# Patient Record
Sex: Female | Born: 1975 | Race: White | Hispanic: No | Marital: Single | Smoking: Current every day smoker
Health system: Southern US, Community
[De-identification: ages and names within clinical notes are randomized; demographics above are authoritative.]

## PROBLEM LIST (undated history)

## (undated) DIAGNOSIS — R1115 Cyclical vomiting syndrome unrelated to migraine: Secondary | ICD-10-CM

## (undated) DIAGNOSIS — F329 Major depressive disorder, single episode, unspecified: Secondary | ICD-10-CM

## (undated) DIAGNOSIS — F32A Depression, unspecified: Secondary | ICD-10-CM

---

## 2017-01-25 ENCOUNTER — Emergency Department (HOSPITAL_BASED_OUTPATIENT_CLINIC_OR_DEPARTMENT_OTHER)
Admission: EM | Admit: 2017-01-25 | Discharge: 2017-01-26 | Disposition: A | Discharge status: Home / Self Care | Payer: Self-pay | Attending: Emergency Medicine | Admitting: Emergency Medicine

## 2017-01-25 ENCOUNTER — Emergency Department (HOSPITAL_BASED_OUTPATIENT_CLINIC_OR_DEPARTMENT_OTHER): Payer: Self-pay

## 2017-01-25 ENCOUNTER — Encounter (HOSPITAL_BASED_OUTPATIENT_CLINIC_OR_DEPARTMENT_OTHER): Payer: Self-pay | Admitting: Emergency Medicine

## 2017-01-25 DIAGNOSIS — R1115 Cyclical vomiting syndrome unrelated to migraine: Secondary | ICD-10-CM

## 2017-01-25 DIAGNOSIS — G43A0 Cyclical vomiting, not intractable: Secondary | ICD-10-CM | POA: Insufficient documentation

## 2017-01-25 DIAGNOSIS — F17200 Nicotine dependence, unspecified, uncomplicated: Secondary | ICD-10-CM | POA: Insufficient documentation

## 2017-01-25 DIAGNOSIS — Z79899 Other long term (current) drug therapy: Secondary | ICD-10-CM | POA: Insufficient documentation

## 2017-01-25 HISTORY — DX: Cyclical vomiting syndrome unrelated to migraine: R11.15

## 2017-01-25 HISTORY — DX: Depression, unspecified: F32.A

## 2017-01-25 HISTORY — DX: Major depressive disorder, single episode, unspecified: F32.9

## 2017-01-25 LAB — CBC WITH DIFFERENTIAL/PLATELET
BASOS ABS: 0 10*3/uL (ref 0.0–0.1)
Basophils Relative: 0 %
EOS ABS: 0 10*3/uL (ref 0.0–0.7)
Eosinophils Relative: 0 %
HEMATOCRIT: 41.2 % (ref 36.0–46.0)
HEMOGLOBIN: 13.6 g/dL (ref 12.0–15.0)
LYMPHS PCT: 6 %
Lymphs Abs: 1.2 10*3/uL (ref 0.7–4.0)
MCH: 29.6 pg (ref 26.0–34.0)
MCHC: 33 g/dL (ref 30.0–36.0)
MCV: 89.8 fL (ref 78.0–100.0)
MONOS PCT: 2 %
Monocytes Absolute: 0.4 10*3/uL (ref 0.1–1.0)
Neutro Abs: 18.6 10*3/uL — ABNORMAL HIGH (ref 1.7–7.7)
Neutrophils Relative %: 92 %
Platelets: 290 10*3/uL (ref 150–400)
RBC: 4.59 MIL/uL (ref 3.87–5.11)
RDW: 14.6 % (ref 11.5–15.5)
WBC: 20.2 10*3/uL — AB (ref 4.0–10.5)

## 2017-01-25 LAB — COMPREHENSIVE METABOLIC PANEL
ALBUMIN: 4.9 g/dL (ref 3.5–5.0)
ALK PHOS: 89 U/L (ref 38–126)
ALT: 38 U/L (ref 14–54)
AST: 66 U/L — AB (ref 15–41)
Anion gap: 13 (ref 5–15)
BILIRUBIN TOTAL: 0.6 mg/dL (ref 0.3–1.2)
BUN: 10 mg/dL (ref 6–20)
CALCIUM: 9.5 mg/dL (ref 8.9–10.3)
CO2: 21 mmol/L — ABNORMAL LOW (ref 22–32)
Chloride: 104 mmol/L (ref 101–111)
Creatinine, Ser: 0.68 mg/dL (ref 0.44–1.00)
GFR calc Af Amer: >60 mL/min (ref >60)
GFR calc non Af Amer: >60 mL/min (ref >60)
GLUCOSE: 154 mg/dL — AB (ref 65–99)
Potassium: 4 mmol/L (ref 3.5–5.1)
SODIUM: 138 mmol/L (ref 135–145)
TOTAL PROTEIN: 8.7 g/dL — AB (ref 6.5–8.1)

## 2017-01-25 LAB — URINALYSIS, ROUTINE W REFLEX MICROSCOPIC
Bilirubin Urine: NEGATIVE
GLUCOSE, UA: NEGATIVE mg/dL
KETONES UR: 15 mg/dL — AB
LEUKOCYTES UA: NEGATIVE
NITRITE: NEGATIVE
PH: 6 (ref 5.0–8.0)
Protein, ur: >300 mg/dL — AB

## 2017-01-25 LAB — URINALYSIS, MICROSCOPIC (REFLEX)

## 2017-01-25 LAB — PREGNANCY, URINE: Preg Test, Ur: NEGATIVE

## 2017-01-25 LAB — LIPASE, BLOOD: LIPASE: 30 U/L (ref 11–51)

## 2017-01-25 MED ORDER — PROMETHAZINE HCL 25 MG/ML IJ SOLN
25.0000 mg | Freq: Once | INTRAMUSCULAR | Status: AC
  Administered 2017-01-25: 25 mg via INTRAVENOUS
  Filled 2017-01-25: qty 1

## 2017-01-25 MED ORDER — HALOPERIDOL LACTATE 5 MG/ML IJ SOLN
2.0000 mg | Freq: Once | INTRAMUSCULAR | Status: AC
  Administered 2017-01-25: 2 mg via INTRAVENOUS
  Filled 2017-01-25: qty 1

## 2017-01-25 MED ORDER — SODIUM CHLORIDE 0.9 % IV BOLUS (SEPSIS)
1000.0000 mL | Freq: Once | INTRAVENOUS | Status: AC
  Administered 2017-01-25: 1000 mL via INTRAVENOUS

## 2017-01-25 MED ORDER — ONDANSETRON 4 MG PO TBDP
4.0000 mg | ORAL_TABLET | Freq: Once | ORAL | Status: DC | PRN
Start: 2017-01-25 — End: 2017-01-25
  Filled 2017-01-25: qty 1

## 2017-01-25 MED ORDER — CAPSAICIN 0.075 % EX CREA
TOPICAL_CREAM | CUTANEOUS | Status: AC
  Administered 2017-01-25: 22:00:00 via TOPICAL
  Filled 2017-01-25: qty 60

## 2017-01-25 MED ORDER — ONDANSETRON 4 MG PO TBDP
4.0000 mg | ORAL_TABLET | Freq: Once | ORAL | Status: AC | PRN
  Administered 2017-01-25: 4 mg via ORAL

## 2017-01-25 NOTE — ED Notes (Signed)
Unable to perform EKG-pt out of room in radiology.

## 2017-01-25 NOTE — ED Notes (Signed)
Patient now laying down  With blanket on couch in hallway  - the patient given zofran and tolerated.

## 2017-01-25 NOTE — ED Triage Notes (Addendum)
Patient states that she has a history of cyclical vomiting syndrome. THe patient is very anxious and hyperventaling and vomiting constantly in triage, keeps asking to lay down . Reports that she went to [facility] but their computers were down so she came here instead. Reports that she usually gets phenergan and Reglan  - Zofran does not help. While triaging the patient she reports that she is having chest pain to her left chest

## 2017-01-25 NOTE — ED Notes (Signed)
VO obtained from EDP to try Capsicum cream topically

## 2017-01-25 NOTE — ED Notes (Signed)
Pt. Actively vomiting asking for phenergan

## 2017-01-25 NOTE — ED Provider Notes (Signed)
[facility] EMERGENCY DEPARTMENT Provider Note   CSN: [id number] Arrival date & time: 01/25/17  1846     History   Chief Complaint Chief Complaint  Patient presents with  . Emesis    HPI Karen Hawkins is a 41 y.o. female.  HPI 41 year old female who presents with nausea and vomiting. She has a history of cyclic vomiting. Reports onset of intractable nausea and vomiting at 6 AM this morning. Associated with upper abdominal pain. States that this is consistent with her typical cyclical vomiting syndrome which she has flareups every month.also complains of chest pain from vomiting but denies any shortness of breath, cough or fever. Denies any diarrhea or constipation, dysuria or urinary frequency.   Past Medical History:  Diagnosis Date  . Cyclical vomiting   . Depression     There are no active problems to display for this patient.   History reviewed. No pertinent surgical history.  OB History    No data available       Home Medications    Prior to Admission medications   Medication Sig Start Date End Date Taking? Authorizing Provider  promethazine (PHENERGAN) 50 MG tablet Take 50 mg by mouth every 6 (six) hours as needed for nausea or vomiting.   Yes [provider]  sertraline (ZOLOFT) 100 MG tablet Take 100 mg by mouth daily.   Yes [provider]  metoCLOPramide (REGLAN) 10 MG tablet Take 1 tablet (10 mg total) by mouth every 6 (six) hours. 01/26/17   Lavera Guise, MD    Family History History reviewed. No pertinent family history.  Social History Social History  Substance Use Topics  . Smoking status: Current Every Day Smoker  . Smokeless tobacco: Never Used  . Alcohol use No     Allergies   Diprivan [propofol]; Doxycycline; Hydrocodone; Oxycodone; and Penicillins   Review of Systems Review of Systems  Constitutional: Negative for fever.  Respiratory: Negative for shortness of breath.   Cardiovascular: Positive for  chest pain.  Gastrointestinal: Positive for abdominal pain, nausea and vomiting.  All other systems reviewed and are negative.    Physical Exam Updated Vital Signs BP (!) 154/86 (BP Location: Left Arm)   Pulse 72   Temp 98.5 F (36.9 C) (Oral)   Resp 18   Wt 99.8 kg (220 lb)   LMP 01/25/2017   SpO2 98%   Physical Exam Physical Exam  Nursing note and vitals reviewed. Constitutional: Well developed, well nourished, non-toxic, and in no acute distress Head: Normocephalic and atraumatic.  Mouth/Throat: Oropharynx is clear and moist.  Neck: Normal range of motion. Neck supple.  Cardiovascular: Normal rate and regular rhythm.   Pulmonary/Chest: Effort normal and breath sounds normal.  Abdominal: Soft. There is epigastric tenderness. There is no rebound and no guarding.  Musculoskeletal: Normal range of motion.  Neurological: Alert, no facial droop, fluent speech, moves all extremities symmetrically Skin: Skin is warm and dry.  Psychiatric: Cooperative   ED Treatments / Results  Labs (all labs ordered are listed, but only abnormal results are displayed) Labs Reviewed  CBC WITH DIFFERENTIAL/PLATELET - Abnormal; Notable for the following:       Result Value   WBC 20.2 (*)    Neutro Abs 18.6 (*)    All other components within normal limits  COMPREHENSIVE METABOLIC PANEL - Abnormal; Notable for the following:    CO2 21 (*)    Glucose, Bld 154 (*)    Total Protein 8.7 (*)  AST 66 (*)    All other components within normal limits  URINALYSIS, ROUTINE W REFLEX MICROSCOPIC - Abnormal; Notable for the following:    Specific Gravity, Urine >1.030 (*)    Hgb urine dipstick LARGE (*)    Ketones, ur 15 (*)    Protein, ur >300 (*)    All other components within normal limits  URINALYSIS, MICROSCOPIC (REFLEX) - Abnormal; Notable for the following:    Bacteria, UA MANY (*)    Squamous Epithelial / LPF 0-5 (*)    All other components within normal limits  PREGNANCY, URINE  LIPASE,  BLOOD  TROPONIN I    EKG  EKG Interpretation  Date/Time:  Friday January 25 2017 23:58:32 EDT Ventricular Rate:  90 PR Interval:    QRS Duration: 156 QT Interval:  436 QTC Calculation: 534 R Axis:   66 Text Interpretation:  Sinus rhythm Left bundle branch block no previous EKG  Confirmed by Crista CurbLiu, Milen Lengacher [phone]) on 01/26/2017 12:01:55 AM       Radiology Dg Chest 2 View  Result Date: 01/25/2017 CLINICAL DATA:  Nausea and vomiting beginning at 6 a.m. this morning. Left-sided chest pain. EXAM: CHEST  2 VIEW COMPARISON:  10/21/2016 FINDINGS: The heart size and mediastinal contours are within normal limits. Both lungs are clear. The visualized skeletal structures are unremarkable. IMPRESSION: No active cardiopulmonary disease. Electronically Signed   By: Burman NievesWilliam  Stevens M.D.   On: 01/25/2017 23:19    Procedures Procedures (including critical care time)  Medications Ordered in ED Medications  metoCLOPramide (REGLAN) injection 10 mg (not administered)  ondansetron (ZOFRAN-ODT) disintegrating tablet 4 mg (4 mg Oral Given 01/25/17 2007)  sodium chloride 0.9 % bolus 1,000 mL (1,000 mLs Intravenous New Bag/Given 01/25/17 2135)  capsicum (ZOSTRIX) 0.075 % cream ( Topical Given 01/25/17 2136)  promethazine (PHENERGAN) injection 25 mg (25 mg Intravenous Given 01/25/17 2323)  haloperidol lactate (HALDOL) injection 2 mg (2 mg Intravenous Given 01/25/17 2320)     Initial Impression / Assessment and Plan / ED Course  I have reviewed the triage vital signs and the nursing notes.  Pertinent labs & imaging results that were available during my care of the patient were reviewed by me and considered in my medical decision making (see chart for details).     Presenting with n/v consistent with prior episodes of cyclic vomiting.   Vitals stable. Abdomen soft, non-surgical. Upper tenderness noted. Low suspicion for serious intraabdominal process.   Blood work with leukocytosis of 20. Suspect  that this is stress related from vomiting. No concerns for infection. UA w/o signs of infection. CXR w/o acute cardiopulmonary processes. Remainder of blood work unremarkable.   Treated symptomatically. Received IVF, zofran, Capsacin, phenergan and haldol. Initially feeling well, but after PO challenging did have some vomiting. She is requesting reglan to see if she can improve to go home. Will trial reglan, and discussed admission for persistent nausea/vomiting. She is agreeable with the plan.   Final Clinical Impressions(s) / ED Diagnoses   Final diagnoses:  Non-intractable cyclical vomiting with nausea    New Prescriptions New Prescriptions   METOCLOPRAMIDE (REGLAN) 10 MG TABLET    Take 1 tablet (10 mg total) by mouth every 6 (six) hours.     Lavera GuiseLiu, Tanmay Halteman Duo, MD 01/26/17 [phone]

## 2017-01-25 NOTE — ED Notes (Signed)
Patient offered zofran while waiting reports " I will take it, that wont mess me up from getting my phenergan?"

## 2017-01-26 LAB — TROPONIN I: Troponin I: <0.03 ng/mL (ref <0.03)

## 2017-01-26 MED ORDER — PROMETHAZINE HCL 25 MG/ML IJ SOLN: 25.0000 mg | Freq: Once | INTRAMUSCULAR | Status: DC

## 2017-01-26 MED ORDER — METOCLOPRAMIDE HCL 10 MG PO TABS: 10.0000 mg | ORAL_TABLET | Freq: Four times a day (QID) | ORAL | 0 refills | Status: AC

## 2017-01-26 MED ORDER — METOCLOPRAMIDE HCL 5 MG/ML IJ SOLN
10.0000 mg | Freq: Once | INTRAMUSCULAR | Status: AC
  Administered 2017-01-26: 10 mg via INTRAVENOUS
  Filled 2017-01-26: qty 2

## 2017-01-26 MED ORDER — PROMETHAZINE HCL 25 MG RE SUPP: 25.0000 mg | Freq: Four times a day (QID) | RECTAL | 0 refills | Status: AC | PRN

## 2017-01-26 NOTE — ED Provider Notes (Addendum)
  Physical Exam  BP (!) 154/86 (BP Location: Left Arm)   Pulse 72   Temp 98.5 F (36.9 C) (Oral)   Resp 18   Wt 99.8 kg (220 lb)   LMP 01/25/2017   SpO2 98%   Physical Exam  ED Course  Procedures  MDM  Assuming care of patient from Dr. Verdie MosherLiu   Patient in the ED for nausea and emesis. Workup thus far shows elevated white count, which is presumed to be reactive given normal exam and no peritoneal signs.  Concerning findings are as following  Important pending results are : po challenge  According to Dr. Verdie MosherLiu, Pt has received zofran, haldol, capsacin, promethazine. She is getting reglan now. If patient doesn't improve she will need admission.  Patient had no complains, no concerns from the nursing side. Will continue to monitor.  3:49 AM Pt reassessed. Pt's VSS and WNL. Pt's cap refill < 3 seconds. Pt has been hydrated in the ER and now passed po challenge. We will discharge with antiemetic. Strict ER return precautions have been discussed and pt will return if he is unable to tolerate fluids and symptoms are getting worse.           Derwood KaplanNanavati, Zaylon Bossier, MD 01/26/17 [id number]    Derwood KaplanNanavati, Delainy Mcelhiney, MD 01/26/17 [phone]

## 2017-01-26 NOTE — Discharge Instructions (Signed)
Continue to take nausea medications at home.  Return for worsening symptoms, including fever, escalating pain, intractable vomiting or any other symptoms concerning to you.

## 2019-06-28 IMAGING — CR DG CHEST 2V
2 series · 2 of 2 positions shown · non-contrast
Comparison: 10/21/2016

CLINICAL DATA: Nausea and vomiting beginning at 6 a.m. this
morning. Left-sided chest pain.

EXAM:
CHEST  2 VIEW

[w chest pa]
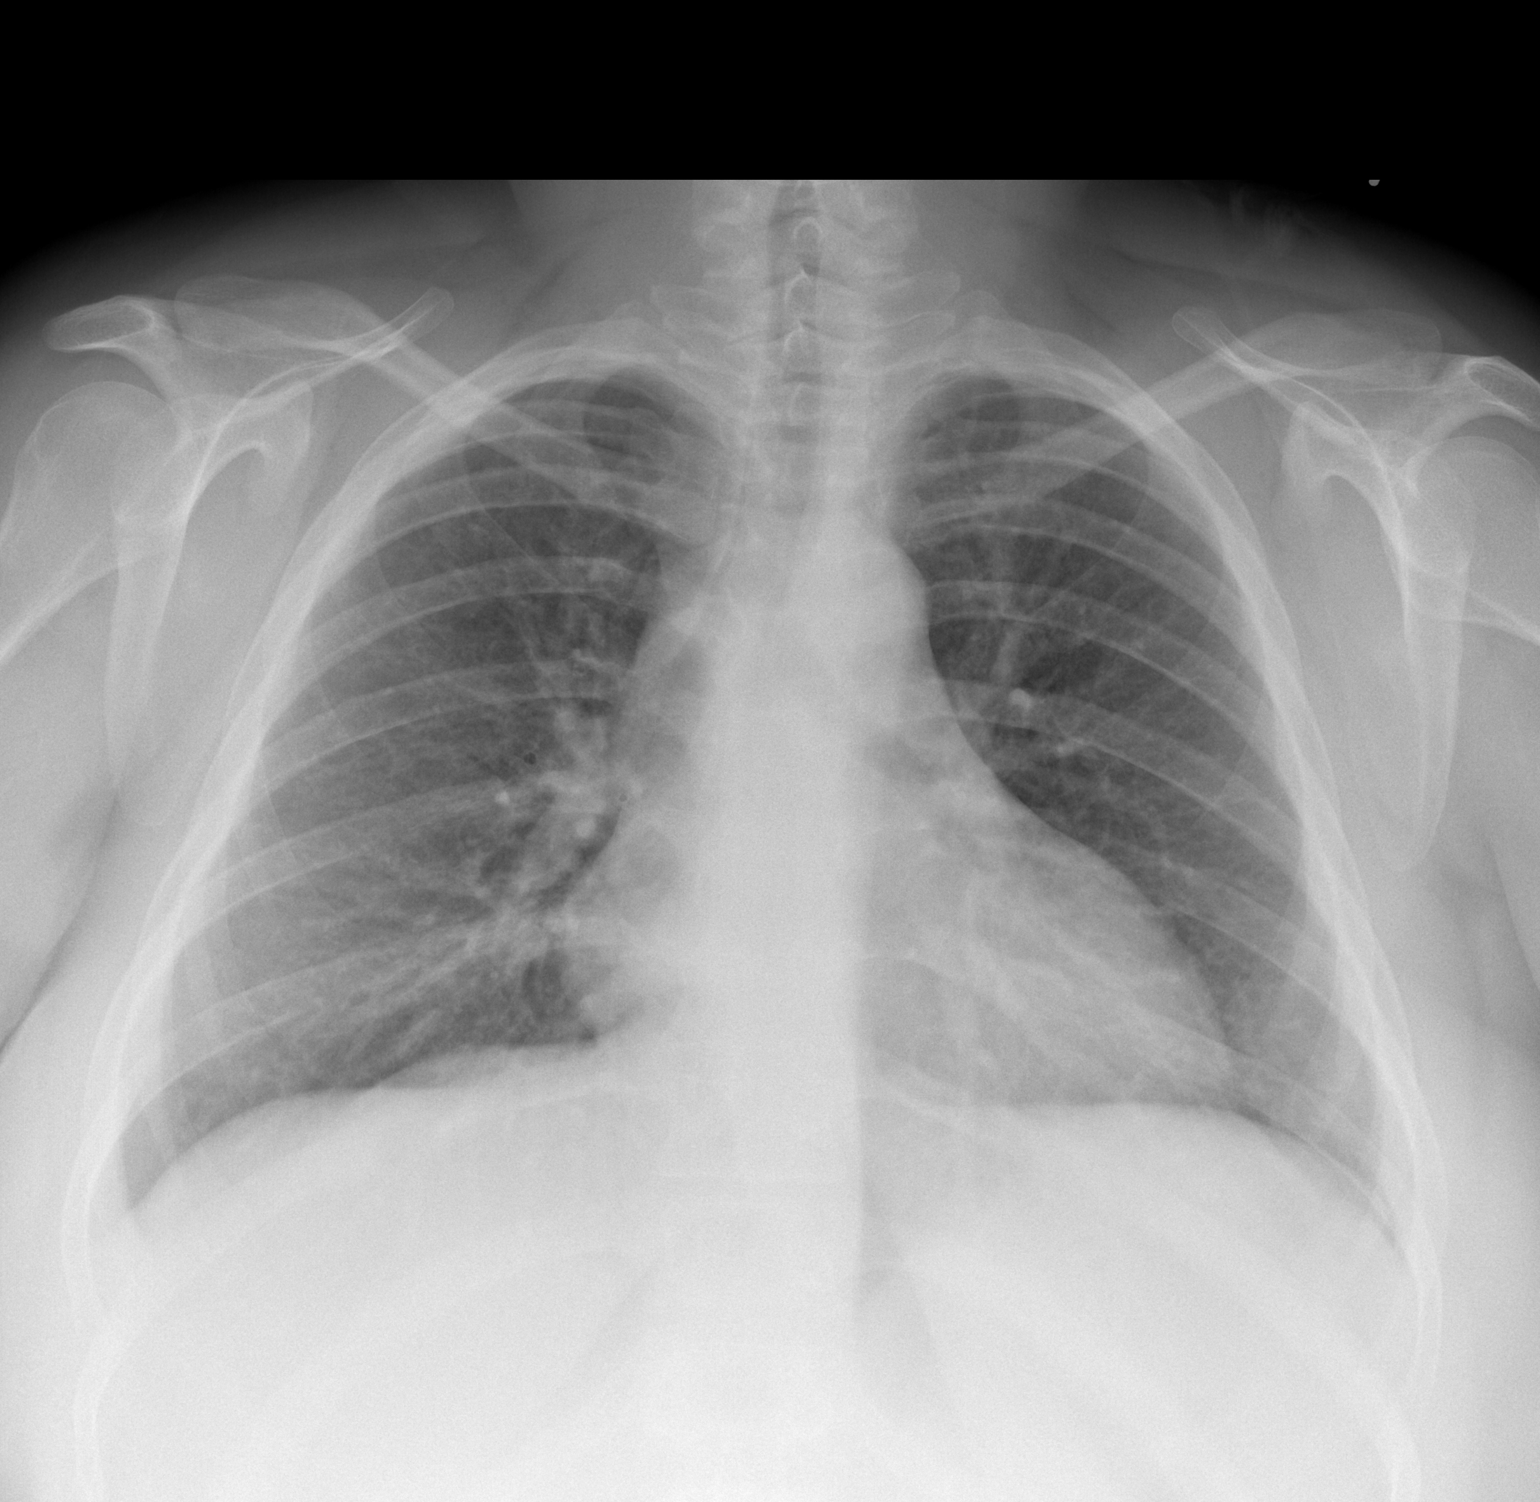

[w chest lat]
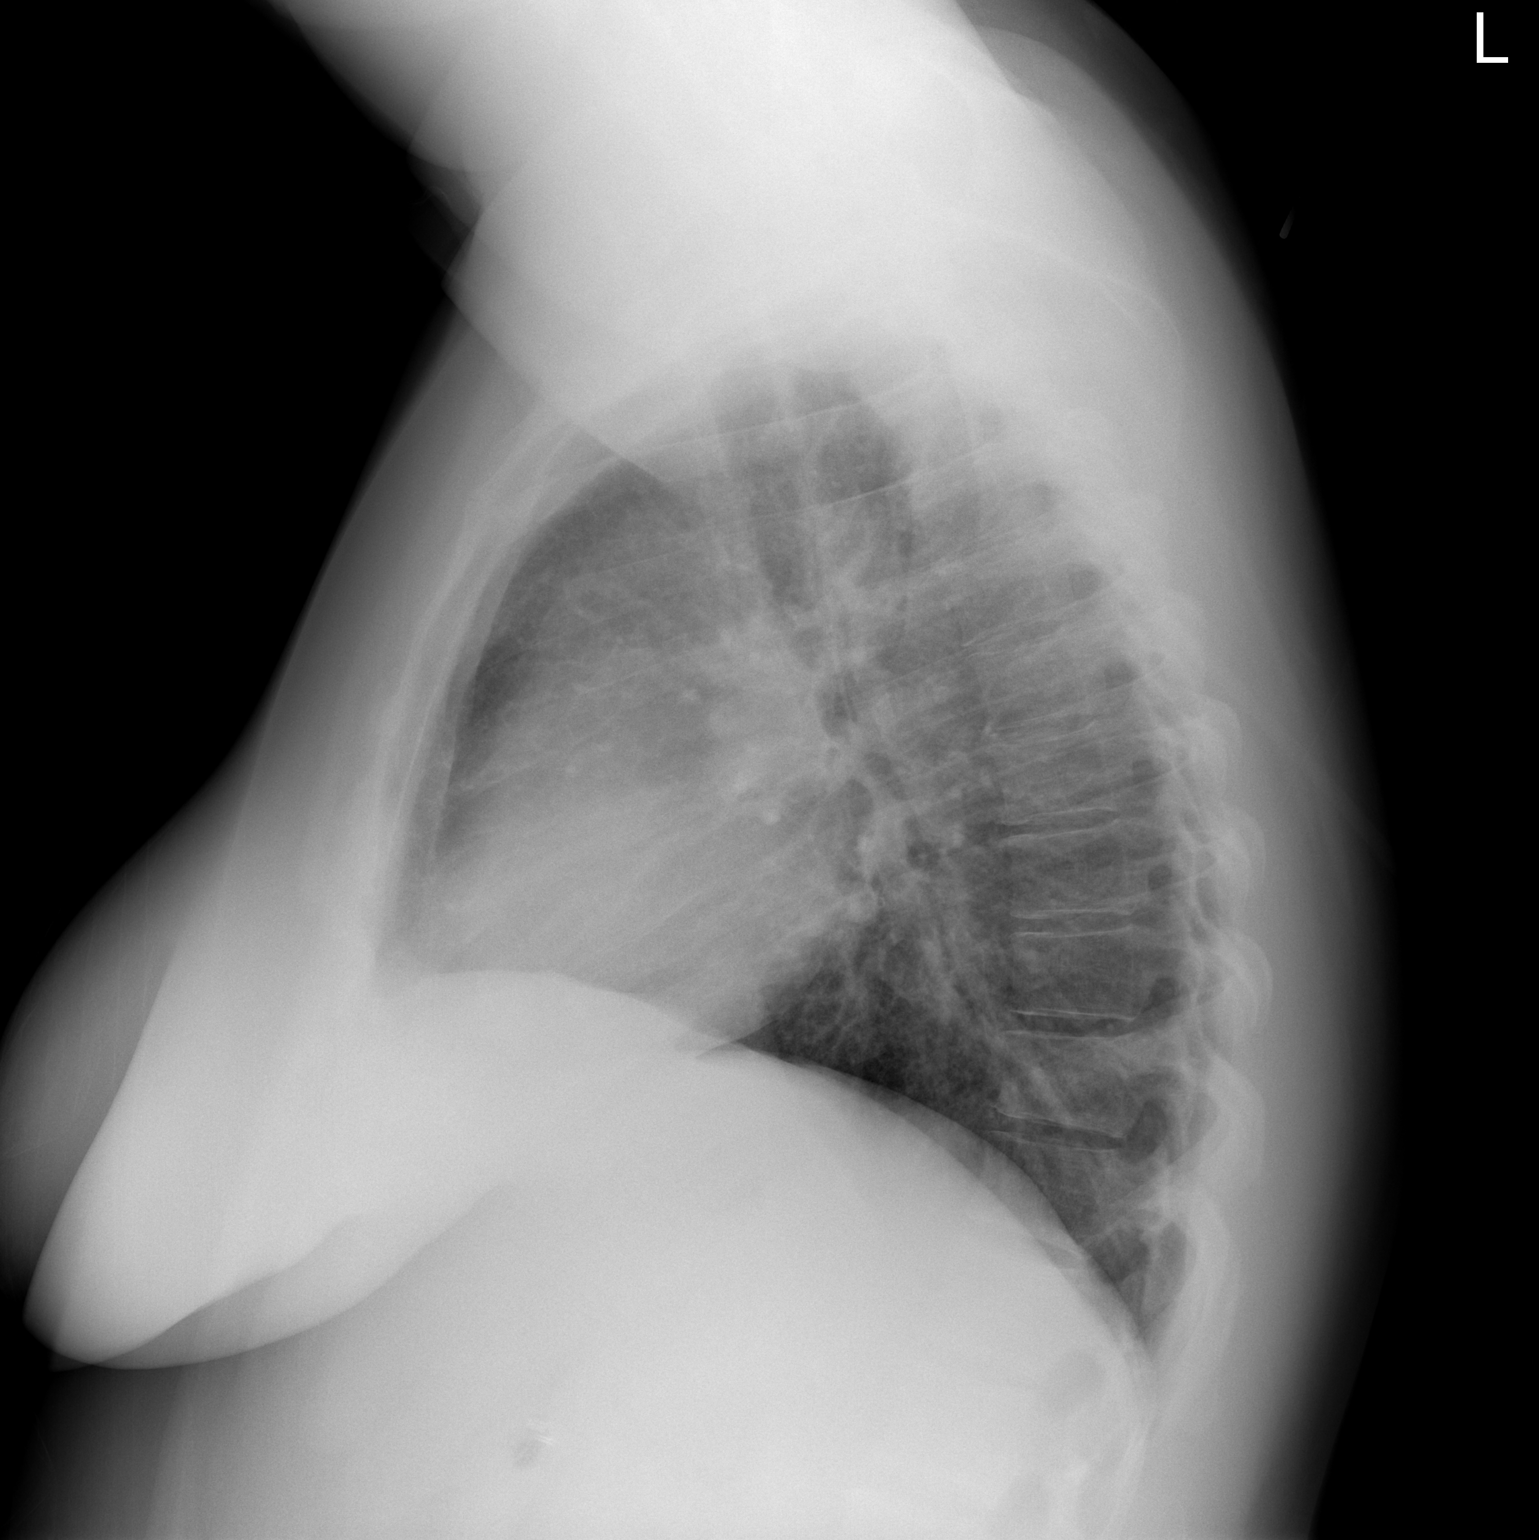

[2 of 2 positions shown; findings below may reference images not displayed]

FINDINGS: The heart size and mediastinal contours are within normal limits.
Both lungs are clear. The visualized skeletal structures are
unremarkable.
IMPRESSION: No active cardiopulmonary disease.
